# Patient Record
Sex: Male | Born: 1960 | Race: White | Hispanic: No | Smoking: Never smoker
Health system: Southern US, Community
[De-identification: ages and names within clinical notes are randomized; demographics above are authoritative.]

## PROBLEM LIST (undated history)

## (undated) DIAGNOSIS — F419 Anxiety disorder, unspecified: Secondary | ICD-10-CM

## (undated) DIAGNOSIS — G8929 Other chronic pain: Secondary | ICD-10-CM

## (undated) HISTORY — DX: Other chronic pain: G89.29

## (undated) HISTORY — PX: APPENDECTOMY: SHX54

## (undated) HISTORY — DX: Anxiety disorder, unspecified: F41.9

---

## 2008-06-29 ENCOUNTER — Ambulatory Visit: Payer: Self-pay | Admitting: Cardiology

## 2009-11-20 HISTORY — PX: COLONOSCOPY W/ POLYPECTOMY: SHX1380

## 2010-04-12 ENCOUNTER — Encounter: Payer: Self-pay | Admitting: Urgent Care

## 2010-04-12 ENCOUNTER — Ambulatory Visit: Payer: Self-pay | Admitting: Gastroenterology

## 2010-04-12 DIAGNOSIS — R109 Unspecified abdominal pain: Secondary | ICD-10-CM | POA: Insufficient documentation

## 2010-04-12 DIAGNOSIS — K921 Melena: Secondary | ICD-10-CM

## 2010-04-12 DIAGNOSIS — F411 Generalized anxiety disorder: Secondary | ICD-10-CM | POA: Insufficient documentation

## 2010-04-13 LAB — CONVERTED CEMR LAB
ALT: 89 units/L — ABNORMAL HIGH (ref 0–53)
AST: 58 units/L — ABNORMAL HIGH (ref 0–37)
Albumin: 4.8 g/dL (ref 3.5–5.2)
Basophils Absolute: 0 10*3/uL (ref 0.0–0.1)
Basophils Relative: 0 % (ref 0–1)
Calcium: 10.1 mg/dL (ref 8.4–10.5)
Eosinophils Absolute: 0.2 10*3/uL (ref 0.0–0.7)
Eosinophils Relative: 2 % (ref 0–5)
Glucose, Bld: 89 mg/dL (ref 70–99)
Indirect Bilirubin: 0.6 mg/dL (ref 0.0–0.9)
Lymphocytes Relative: 31 % (ref 12–46)
MCHC: 35.3 g/dL (ref 30.0–36.0)
Monocytes Absolute: 0.8 10*3/uL (ref 0.1–1.0)
Neutro Abs: 4.5 10*3/uL (ref 1.7–7.7)
Potassium: 5.1 meq/L (ref 3.5–5.3)
Sodium: 140 meq/L (ref 135–145)
Total Bilirubin: 0.8 mg/dL (ref 0.3–1.2)

## 2010-04-15 ENCOUNTER — Ambulatory Visit (HOSPITAL_COMMUNITY): Admission: RE | Admit: 2010-04-15 | Discharge: 2010-04-15 | Payer: Self-pay | Admitting: Gastroenterology

## 2010-04-19 ENCOUNTER — Telehealth (INDEPENDENT_AMBULATORY_CARE_PROVIDER_SITE_OTHER): Payer: Self-pay

## 2010-04-19 ENCOUNTER — Encounter: Payer: Self-pay | Admitting: Gastroenterology

## 2010-05-05 ENCOUNTER — Ambulatory Visit: Payer: Self-pay | Admitting: Gastroenterology

## 2010-05-05 ENCOUNTER — Ambulatory Visit (HOSPITAL_COMMUNITY): Admission: RE | Admit: 2010-05-05 | Discharge: 2010-05-05 | Payer: Self-pay | Admitting: Gastroenterology

## 2010-05-05 HISTORY — PX: COLONOSCOPY: SHX174

## 2010-05-09 ENCOUNTER — Encounter: Payer: Self-pay | Admitting: Gastroenterology

## 2010-12-20 NOTE — Progress Notes (Signed)
Summary: phone note/ requesting pain med  Phone Note Call from Patient   Caller: Patient Summary of Call: Pt called back and said he was half asleep this morning when i called him. He asked what i had told him.  He said he was given RX for a few pain pills, but would like some more til his TCS/Surgical referral. Uses [facility].  Initial call taken by: Cloria Spring LPN,  Apr 19, 2010 9:38 AM     Appended Document: phone note/ requesting pain med Just received VM from pt. He asked to make the pain medicine stronger, said he had to double up on the other to take he was in so much pain.  Appended Document: phone note/ requesting pain med Discussed w/ Dr Darrick Penna.  Can use tylenol for pain until procedure. No further narcotics at this time. To ER if severe pain.  Colonoscopy should be done in OR w/ propofol.  Appended Document: phone note/ requesting pain med LM to call.  Appended Document: phone note/ requesting pain med Pt was informed. York Spaniel he will have to go to the ED because his pain is so bad.

## 2010-12-20 NOTE — Letter (Signed)
Summary: TCS ORDER  TCS ORDER   Imported By: Ave Filter 04/19/2010 10:28:36  _____________________________________________________________________  External Attachment:    Type:   Image     Comment:   External Document  Appended Document: TCS ORDER [facility] wants this done in OR w/ propofol please re:hx narcotic use  Appended Document: TCS ORDER Pt scheduled in the or.

## 2010-12-20 NOTE — Letter (Signed)
Summary: SURGICAL REFERRAL  SURGICAL REFERRAL   Imported By: Ave Filter 05/09/2010 12:32:19  _____________________________________________________________________  External Attachment:    Type:   Image     Comment:   External Document  Appended Document: SURGICAL REFERRAL Pt scheduled 05/19/10@2 :15pm  Pt aware of appt.

## 2010-12-20 NOTE — Letter (Signed)
Summary: CT SCAN ORDER  CT SCAN ORDER   Imported By: Ave Filter 04/12/2010 13:01:19  _____________________________________________________________________  External Attachment:    Type:   Image     Comment:   External Document

## 2010-12-20 NOTE — Assessment & Plan Note (Signed)
Summary: ABD PAIN,PT SUSPECT HE HAS AN HERNIA/CM   Visit Type:  Initial Visit Referring Provider:  self Primary Care Provider:  Sherryll Burger  Chief Complaint:  abd pain.  History of Present Illness: 50 y/o caucasian male self-referred for "hernia."  Tells me he found this while in prison.  Noticed  3 months ago.  Located in right lower quad. Pain radiates to groin.  Pain constant 8/10. Feels at site of appendectom.  BM 3 daily without with small volume scant rectal bleeding on toilet tissue intermittantly.  Denies N/V/D.  Denies fever.  Occ heartburn rarely.  Not on meds.  Denies indigestion.  Denies dysphagia or odynophagia.  Weight loss 10#  in 3 months, unintentional. Pain worse w/ exercise.    Current Problems (verified): 1)  Anxiety  (ICD-300.00) 2)  Hematochezia  (ICD-578.1) 3)  Abdominal Pain  (ICD-789.00)  Current Medications (verified): 1)  Doxepin Hcl 50 Mg Caps (Doxepin Hcl) .... One Tablet At Bedtime 2)  Alprazolam 0.5 Mg Tabs (Alprazolam) .... Take 1 Tablet By Mouth Two Times A Day 3)  Tylenol Extra Strength 500 Mg Tabs (Acetaminophen) .... As Needed 4)  Vicodin 5-500 Mg Tabs (Hydrocodone-Acetaminophen) .Marland Kitchen.. 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  Alcohol Use - no Illicit Drug Use - no Patient gets regular exercise. Smoking Status:  never Drug Use:  no Does Patient Exercise:  yes  Review of Systems General:  Complains of chills, sweats, anorexia, fatigue, weakness, malaise, weight loss, and sleep disorder. CV:  Denies chest pains, angina, palpitations, syncope, dyspnea on exertion, orthopnea, PND, peripheral edema, and claudication. Resp:   Complains of coughing up blood; denies dyspnea at rest, dyspnea with exercise, cough, sputum, wheezing, and pleurisy; prior to going into prison per pt saw PCP for this. GI:  Denies difficulty swallowing, pain on swallowing, vomiting blood, jaundice, diarrhea, constipation, change in bowel habits, black BMs, and fecal incontinence. GU:  Denies urinary burning, blood in urine, urinary frequency, urinary hesitancy, nocturnal urination, and urinary incontinence. Derm:  Denies rash, itching, dry skin, hives, moles, warts, and unhealing ulcers. Psych:  Denies depression, anxiety, memory loss, suicidal ideation, hallucinations, paranoia, phobia, and confusion. Heme:  Denies bruising and enlarged lymph nodes.  Vital Signs:  Patient profile:   50 year old male Height:      71 inches Weight:      196 pounds BMI:     27.44 Temp:     97.9 degrees F oral Pulse rate:   72 / minute BP sitting:   122 / 80  (left arm) Cuff size:   regular  Vitals Entered By: Cloria Spring LPN (Apr 12, 2010 11:52 AM)  Physical Exam  General:  Well developed, well nourished, no acute distress. Head:  Normocephalic and atraumatic. Eyes:  Sclera clear, no icterus. Ears:  Normal auditory acuity. Nose:  No deformity, discharge,  or lesions. Mouth:  No deformity or lesions, dentition normal. Neck:  Supple; no masses or thyromegaly. Lungs:  Clear throughout to auscultation. Heart:  Regular rate and rhythm; no murmurs, rubs,  or bruits. Abdomen:  Soft, nondistended. No masses, hepatosplenomegalymod tender at site.  no appreciable hernia noted on exam.  Normal  bowel sounds.without guarding and without rebound.   Rectal:  deferred until time of colonoscopy.   Msk:  Symmetrical with no gross deformities. Normal posture. Pulses:  Normal pulses noted. Extremities:  No clubbing, cyanosis, edema or deformities noted. Neurologic:  Alert and  oriented x4;  grossly normal neurologically. Skin:  Intact without significant lesions  or rashes. Cervical Nodes:  No significant cervical adenopathy. Axillary Nodes:  No significant axillary adenopathy. Inguinal Nodes:  No significant inguinal adenopathy. Psych:  Alert and cooperative. Normal mood and affect.  Impression & Recommendations:  Problem # 1:  ABDOMINAL PAIN (ICD-789.00) 50 y/o caucasian male w/ 3 month hx Right-sided abd pain along site of previous appendectomy.  ?hernia, scar tissue, or diverticulitis (less likely).  Given FH colon ca, weight loss, and intermittant hematachezia, we need to r/o colorectal ca as well.  CT abd/pelvis w/ IV/oral contrast first, then colonoscopy.  Diagnostic colonoscopy to be performed by Dr. Jonette Eva in the near future.  I have discussed risks and benefits which include, but are not limited to, bleeding, infection, perforation, or medication reaction.  The patient agrees with this plan and consent will be obtained.  Orders: T-Basic Metabolic Panel [id number]) T-CBC w/Diff [phone]) T-Hepatic Function [phone]) New Patient Level III (29528)  Problem # 2:  HEMATOCHEZIA (ICD-578.1) See#1  Problem # 3:  ADENOCARCINOMA, COLON, FAMILY HX (ICD-V16.0) Prescriptions: VICODIN 5-500 MG TABS (HYDROCODONE-ACETAMINOPHEN) 1 by mouth q 4-6 hrs as needed pain  #20 x 0   Entered and Authorized by:   Joselyn Arrow FNP-BC   Signed by:   Joselyn Arrow FNP-BC on 04/12/2010   Method used:   Print then Give to Patient   RxID:   [id number]

## 2011-02-06 LAB — HEMOGLOBIN AND HEMATOCRIT, BLOOD: HCT: 43 % (ref 39.0–52.0)

## 2011-02-06 LAB — BASIC METABOLIC PANEL
BUN: 11 mg/dL (ref 6–23)
Chloride: 101 mEq/L (ref 96–112)
Creatinine, Ser: 1.06 mg/dL (ref 0.4–1.5)
Glucose, Bld: 84 mg/dL (ref 70–99)

## 2015-01-07 ENCOUNTER — Other Ambulatory Visit: Payer: Self-pay | Admitting: Anesthesiology

## 2015-01-07 ENCOUNTER — Ambulatory Visit
Admission: RE | Admit: 2015-01-07 | Discharge: 2015-01-07 | Disposition: A | Discharge status: Home / Self Care | Payer: Medicare HMO | Source: Ambulatory Visit | Attending: Anesthesiology | Admitting: Anesthesiology

## 2015-01-07 DIAGNOSIS — M545 Low back pain: Secondary | ICD-10-CM

## 2015-04-01 ENCOUNTER — Encounter: Payer: Self-pay | Admitting: Gastroenterology

## 2016-02-06 IMAGING — CR DG LUMBAR SPINE COMPLETE 4+V
5 series · 5 of 5 positions shown · non-contrast
Comparison: Lumbar spine series of September 22, 2014.

CLINICAL DATA: Low back pain and numbness in both lower extremities
greater on the right than on the left, history of multiple falls
most recently in October 2014

EXAM:
LUMBAR SPINE - COMPLETE 4+ VIEW

[t l-spine a.p.]
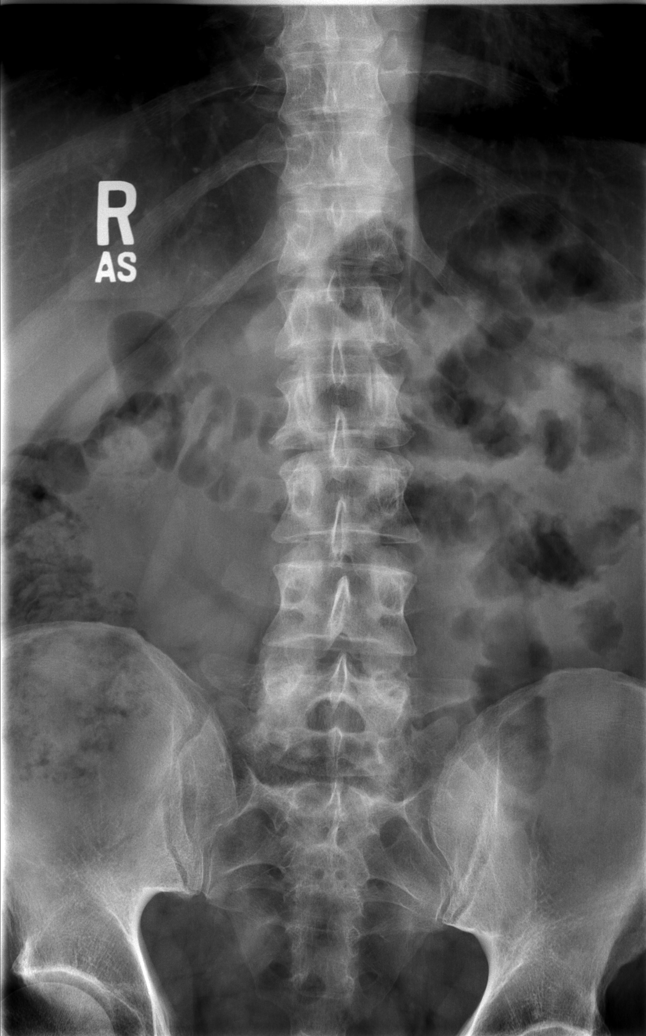

[t l-spine oblique exposure (1 of 2)]
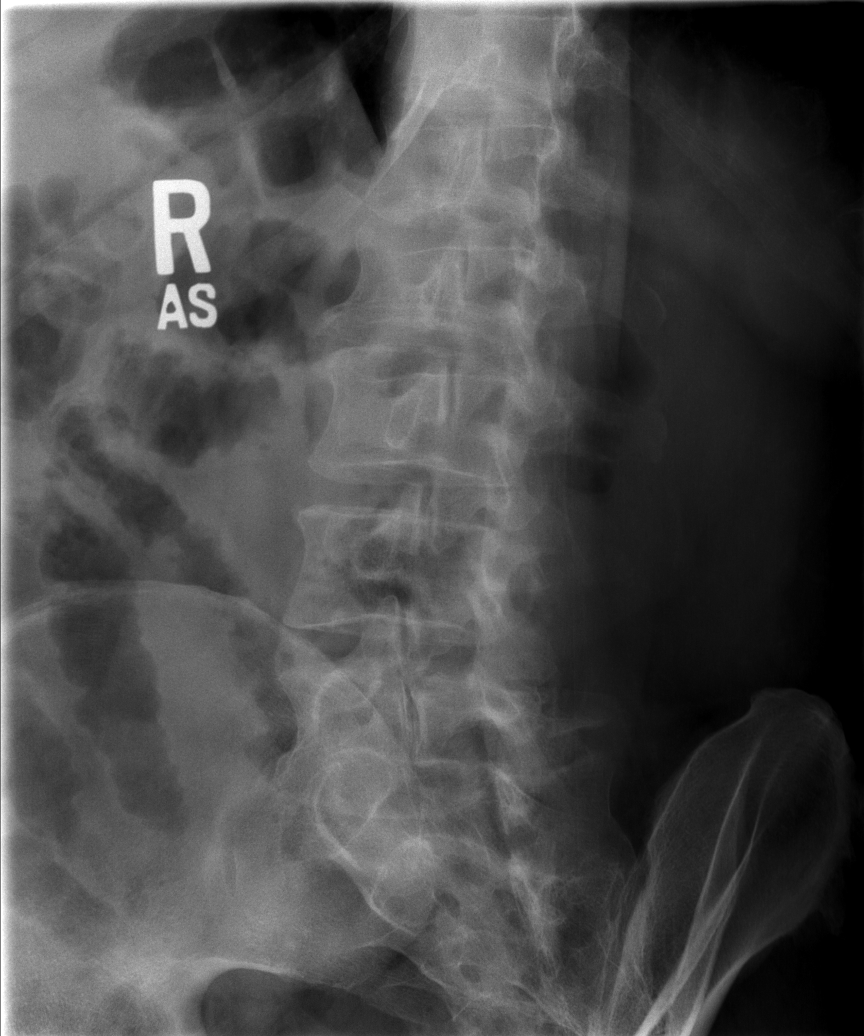

[t l-spine oblique exposure (2 of 2)]
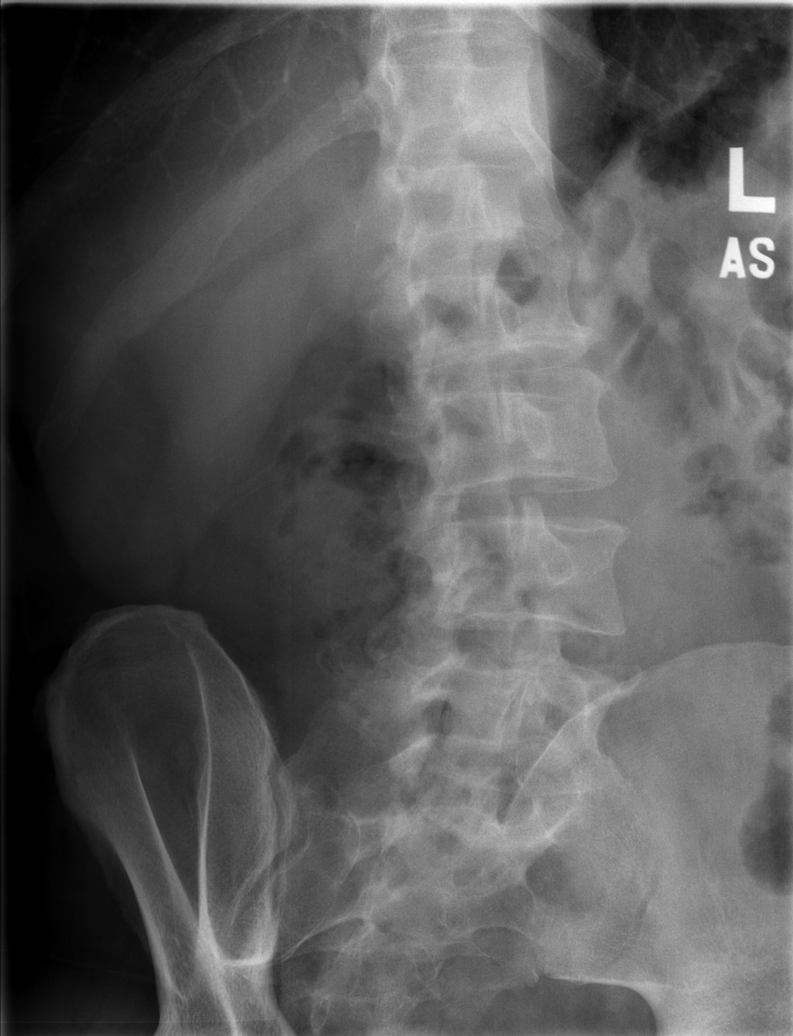

[t l-spine lat]
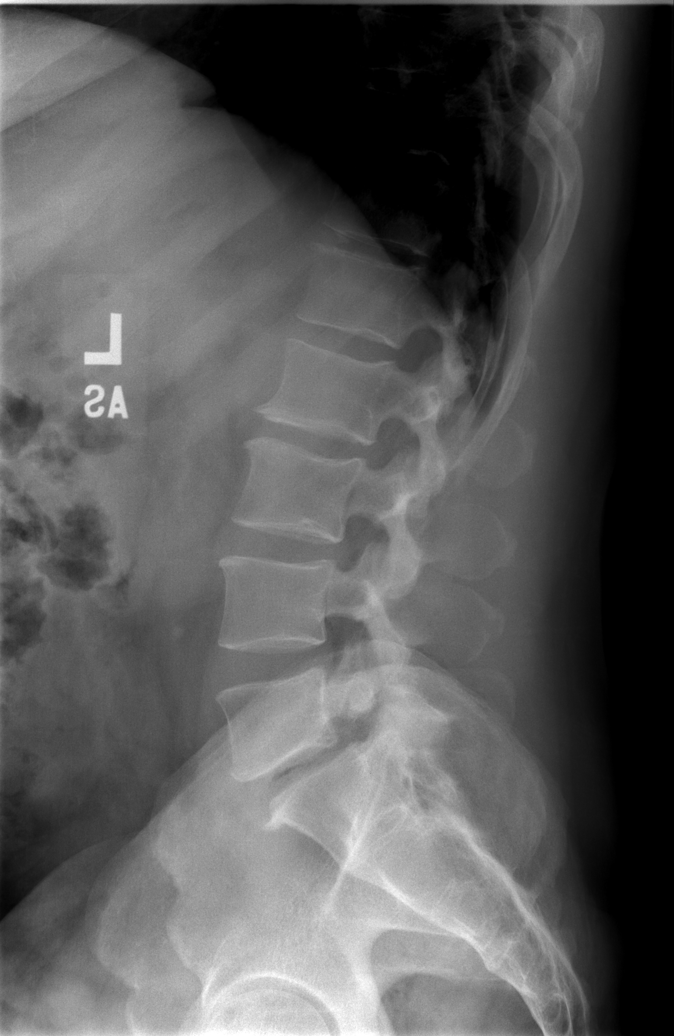

[t l-spine l5-s1 spot]
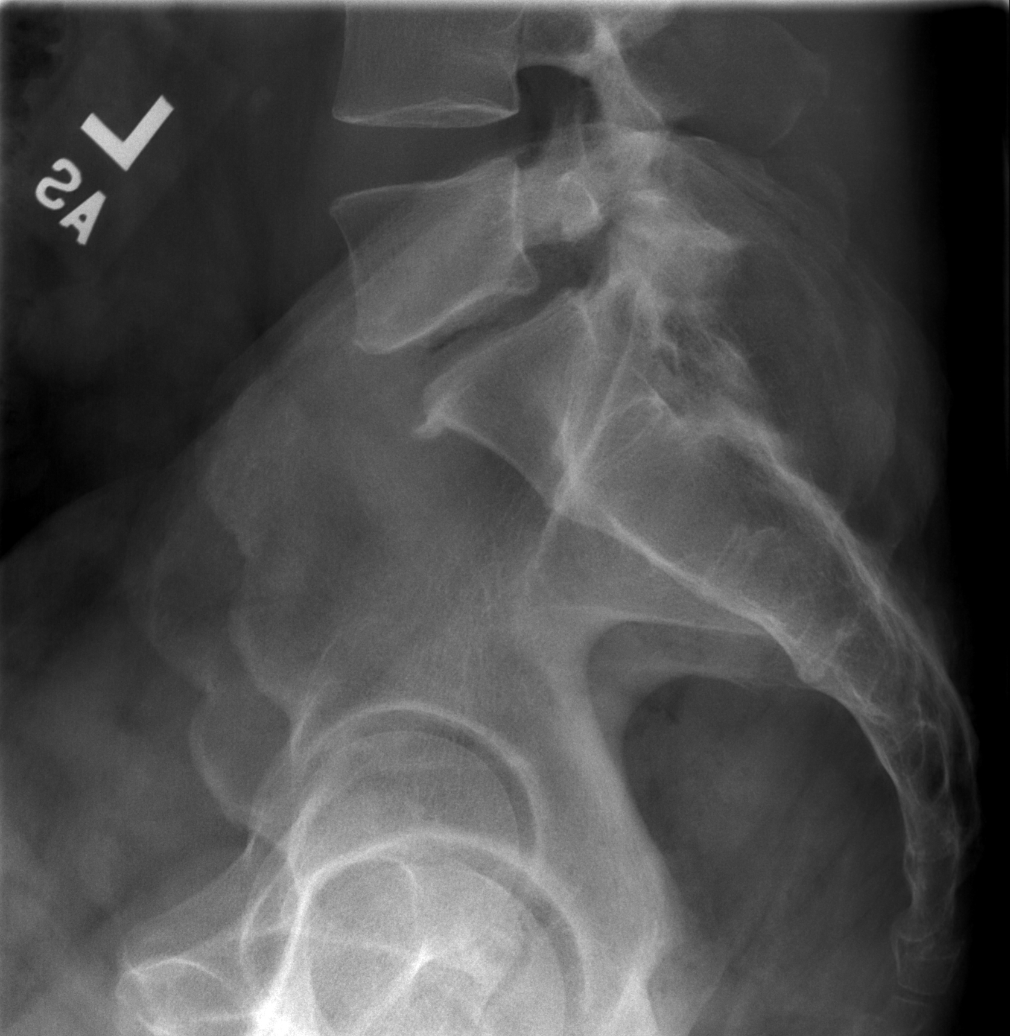

[5 of 5 positions shown; findings below may reference images not displayed]

FINDINGS: The lumbar vertebral bodies are preserved in height. There is grade
1 anterolisthesis of L5 with respect S1 on the basis of pars defects
that is more conspicuous today. The there is mild stable narrowing
of the L2-3 disc space with small anterior endplate osteophytes. The
pedicles and transverse processes are intact. The observed portions
of the sacrum are unremarkable.
IMPRESSION: 1. Increased conspicuity of grade 1 anterolisthesis of L5 with
respect S1. This is on the basis of bilateral pars defects and
degenerative disc disease.
2. Mild disc space narrowing and endplate osteophyte at L2-3. There
is no compression fracture.

## 2016-02-29 ENCOUNTER — Ambulatory Visit: Payer: Medicare HMO | Admitting: Gastroenterology

## 2016-03-24 ENCOUNTER — Ambulatory Visit: Payer: Medicare HMO | Admitting: Gastroenterology

## 2016-05-04 ENCOUNTER — Ambulatory Visit: Payer: Medicare HMO | Admitting: Nurse Practitioner

## 2018-10-23 ENCOUNTER — Telehealth: Payer: Self-pay | Admitting: *Deleted

## 2018-10-23 ENCOUNTER — Encounter: Payer: Self-pay | Admitting: Gastroenterology

## 2018-10-23 ENCOUNTER — Ambulatory Visit: Payer: Medicare Other | Admitting: Gastroenterology

## 2018-10-23 ENCOUNTER — Encounter: Payer: Self-pay | Admitting: *Deleted

## 2018-10-23 ENCOUNTER — Other Ambulatory Visit: Payer: Self-pay | Admitting: *Deleted

## 2018-10-23 DIAGNOSIS — Z860100 Personal history of colon polyps, unspecified: Secondary | ICD-10-CM | POA: Insufficient documentation

## 2018-10-23 DIAGNOSIS — Z800 Family history of malignant neoplasm of digestive organs: Secondary | ICD-10-CM

## 2018-10-23 NOTE — Assessment & Plan Note (Signed)
NO WARNING SIGNS/SYMPTOMS.   DRINK WATER TO KEEP YOUR URINE LIGHT YELLOW. FOLLOW A HIGH FIBER DIET. AVOID ITEMS THAT CAUSE BLOATING & GAS.  HANDOUT GIVEN. COMPLETE COLONOSCOPY  IN 3-4 WEEKS w/ MAC due to polypharmacy. YOU MAY BRING THE ENEMA TO ADMINISTER IN THE PREOP AREA. DISCUSSED PROCEDURE, BENEFITS, & RISKS: < 1% chance of medication reaction, bleeding, perforation, or rupture of spleen/liver.  FOLLOW UP IN THE OFFICE WILL BE SCHEDULED IF NEEDED AFTER ENDOSCOPY.

## 2018-10-23 NOTE — Progress Notes (Signed)
CC'D TO PCP °

## 2018-10-23 NOTE — Patient Instructions (Signed)
DRINK WATER TO KEEP YOUR URINE LIGHT YELLOW.  FOLLOW A HIGH FIBER DIET. AVOID ITEMS THAT CAUSE BLOATING & GAS. SEE INFO BELOW.  COMPLETE COLONOSCOPY  IN 3-4 WEEKS. YOU MAY BRING THE ENEMA TO ADMINISTER IN THE PREOP AREA.  FOLLOW UP IN THE OFFICE WILL BE SCHEDULED IF NEEDED AFTER ENDOSCOPY.  High-Fiber Diet A high-fiber diet changes your normal diet to include more whole grains, legumes, fruits, and vegetables. Changes in the diet involve replacing refined carbohydrates with unrefined foods. The calorie level of the diet is essentially unchanged. The Dietary Reference Intake (recommended amount) for adult males is 38 grams per day. For adult females, it is 25 grams per day. Pregnant and lactating women should consume 28 grams of fiber per day. Fiber is the intact part of a plant that is not broken down during digestion. Functional fiber is fiber that has been isolated from the plant to provide a beneficial effect in the body.  PURPOSE  Increase stool bulk.   Ease and regulate bowel movements.   Lower cholesterol.   REDUCE RISK OF COLON CANCER  INDICATIONS THAT YOU NEED MORE FIBER  Constipation and hemorrhoids.   Uncomplicated diverticulosis (intestine condition) and irritable bowel syndrome.   Weight management.   As a protective measure against hardening of the arteries (atherosclerosis), diabetes, and cancer.   GUIDELINES FOR INCREASING FIBER IN THE DIET  Start adding fiber to the diet slowly. A gradual increase of about 5 more grams (2 slices of whole-wheat bread, 2 servings of most fruits or vegetables, or 1 bowl of high-fiber cereal) per day is best. Too rapid an increase in fiber may result in constipation, flatulence, and bloating.   Drink enough water and fluids to keep your urine clear or pale yellow. Water, juice, or caffeine-free drinks are recommended. Not drinking enough fluid may cause constipation.   Eat a variety of high-fiber foods rather than one type of fiber.    Try to increase your intake of fiber through using high-fiber foods rather than fiber pills or supplements that contain small amounts of fiber.   The goal is to change the types of food eaten. Do not supplement your present diet with high-fiber foods, but replace foods in your present diet.  INCLUDE A VARIETY OF FIBER SOURCES  Replace refined and processed grains with whole grains, canned fruits with fresh fruits, and incorporate other fiber sources. White rice, white breads, and most bakery goods contain little or no fiber.   Brown whole-grain rice, buckwheat oats, and many fruits and vegetables are all good sources of fiber. These include: broccoli, Brussels sprouts, cabbage, cauliflower, beets, sweet potatoes, white potatoes (skin on), carrots, tomatoes, eggplant, squash, berries, fresh fruits, and dried fruits.   Cereals appear to be the richest source of fiber. Cereal fiber is found in whole grains and bran. Bran is the fiber-rich outer coat of cereal grain, which is largely removed in refining. In whole-grain cereals, the bran remains. In breakfast cereals, the largest amount of fiber is found in those with "bran" in their names. The fiber content is sometimes indicated on the label.   You may need to include additional fruits and vegetables each day.   In baking, for 1 cup white flour, you may use the following substitutions:   1 cup whole-wheat flour minus 2 tablespoons.   1/2 cup white flour plus 1/2 cup whole-wheat flour.

## 2018-10-23 NOTE — Progress Notes (Signed)
Subjective:    Patient ID: Anthony Knapp, male    DOB: 05-22-1961, 57 y.o.   MRN: [id number] Kirstie Peri, MD  HPI HAS A BM EVERY TIME HE EATS. CAN BE RUNNY TO SOLID. BMs: 4-5. RUNS 2-3 MILES A DA FOR PAST 2-3 YEARS. APPETITE: GOOD. WEIGHT WAS: 190-195 LBS. CURRENT WEIGHT: 167 LBS FOR PAST SEVERAL MOS. ANXIETY: SEVERE, MEDS: Scarlette Calico SEES MH SPECIALIST([facility]). SX: ABOUT THE SAME. HAD PROBLEM WAKING UP FROM EGD DONE BY DR. Gabriel Cirri. MOTHER(BEAT COLON CAN AND THEN GOT LUNG CA) RECENTLY PASSED ~1 YR AGO. SHE MADE HIM PROMISE TO GET TCS. OCCASIONAL ETOH. NO SMOKE. NEGATIVE FOR HEP C. RARE IBUPROFEN. NO ASPIRIN, BC/GOODY POWDERS, IBUPROFEN/MOTRIN, OR NAPROXEN/ALEVE.   PT DENIES FEVER, CHILLS, HEMATOCHEZIA, HEMATEMESIS, nausea, vomiting, melena, CHEST PAIN, SHORTNESS OF BREATH, CHANGE IN BOWEL IN HABITS, constipation, abdominal pain, problems swallowing,  OR heartburn or indigestion.  Past Medical History:  Diagnosis Date  . Anxiety   . Chronic pain     Past Surgical History:  Procedure Laterality Date  . APPENDECTOMY    . COLONOSCOPY  05/05/10   WUJ:[id number] tissue removed  . COLONOSCOPY W/ POLYPECTOMY  2011   3 POLYPOID LESIONS REMOVED   No Known Allergies  Current Outpatient Medications  Medication Sig    . Buprenorphine HCl-Naloxone HCl 12-3 MG FILM Take 1 Film by mouth 2 (two) times daily.    . clonazePAM (KLONOPIN) 1 MG tablet Take 2.5 mg by mouth daily.    . sildenafil (REVATIO) 20 MG tablet Take 20 mg by mouth as needed.     Family History  Problem Relation Age of Onset  . Colon cancer Mother        AGE > 60  . Irritable bowel syndrome Sister   . Crohn's disease Sister   . Diabetes Sister   . Crohn's disease Sister   . Irritable bowel syndrome Sister   . Hepatitis C Sister   . Colon polyps Neg Hx     Social History   Socioeconomic History  . Marital status: Divorced    Spouse name: Not on file  . Number of children: Not on file  .  Years of education: Not on file  . Highest education level: Not on file  Occupational History  . Not on file  Social Needs  . Financial resource strain: Not on file  . Food insecurity:    Worry: Not on file    Inability: Not on file  . Transportation needs:    Medical: Not on file    Non-medical: Not on file  Tobacco Use  . Smoking status: Never Smoker  . Smokeless tobacco: Never Used  Substance and Sexual Activity  . Alcohol use: Yes    Comment: occ  . Drug use: Never  . Sexual activity: Not on file  Lifestyle  . Physical activity:    Days per week: Not on file    Minutes per session: Not on file  . Stress: Not on file  Relationships  . Social connections:    Talks on phone: Not on file    Gets together: Not on file    Attends religious service: Not on file    Active member of club or organization: Not on file    Attends meetings of clubs or organizations: Not on file    Relationship status: Not on file  Other Topics Concern  . Not on file  Social History Narrative   DESCRIBES HIMSELF AS OCD.  MOTHER PASSED IN 2018. [facility] [employer] [facility] [employer] AND THEN [employer]. DISABLED DUE TO SHOULDER INJURY.    Review of Systems     Objective:   Physical Exam  Constitutional: He is oriented to person, place, and time. He appears well-developed and well-nourished. No distress.  HENT:  Head: Normocephalic and atraumatic.  Mouth/Throat: Oropharynx is clear and moist. No oropharyngeal exudate.  Eyes: Pupils are equal, round, and reactive to light. No scleral icterus.  Neck: Normal range of motion. Neck supple.  Cardiovascular: Normal rate, regular rhythm and normal heart sounds.  Pulmonary/Chest: Effort normal and breath sounds normal. No respiratory distress.  Abdominal: Soft. Bowel sounds are normal. He exhibits no distension. There is no tenderness.  Musculoskeletal: He exhibits no edema.  Lymphadenopathy:    He has no cervical adenopathy.  Neurological: He is alert and oriented  to person, place, and time.  NO  NEW FOCAL DEFICITS  Psychiatric:  FLAT AFFECT, SLIGHTLY ANXIOUS MOOD  Vitals reviewed.     Assessment & Plan:

## 2018-10-23 NOTE — Telephone Encounter (Signed)
Pre-op scheduled for 01/10/19 at 10:00am. Called patient and he advised me to speak with his friend Melva (on dpr). She is aware of appt. Letter mailed.

## 2019-01-10 ENCOUNTER — Encounter (HOSPITAL_COMMUNITY)
Admission: RE | Admit: 2019-01-10 | Discharge: 2019-01-10 | Disposition: A | Discharge status: Home / Self Care | Payer: Medicare Other | Source: Ambulatory Visit | Attending: Gastroenterology | Admitting: Gastroenterology

## 2019-01-10 NOTE — Pre-Procedure Instructions (Signed)
Dr Benay Pillow notified of last potassium of 5.3. We Do I-stat on arrival 01/14/2019.

## 2019-01-10 NOTE — Pre-Procedure Instructions (Signed)
Note from 1000 am 01/10/2019 documented on wrong chart.

## 2019-01-13 ENCOUNTER — Telehealth: Payer: Self-pay | Admitting: Gastroenterology

## 2019-01-13 NOTE — Telephone Encounter (Signed)
Endo called and said they could not reach this patient for his procedure tomorrow, called his contact and she stated she soul not be seeing him anymore and did not have an alternative number for him

## 2019-01-13 NOTE — Telephone Encounter (Signed)
Patient showed up for pre-op. Confirmed with carolyn

## 2019-01-14 ENCOUNTER — Encounter (HOSPITAL_COMMUNITY): Payer: Self-pay | Admitting: Gastroenterology

## 2019-01-14 ENCOUNTER — Ambulatory Visit (HOSPITAL_COMMUNITY): Admission: RE | Admit: 2019-01-14 | Payer: Medicare Other | Source: Ambulatory Visit | Admitting: Gastroenterology

## 2019-01-14 ENCOUNTER — Encounter (HOSPITAL_COMMUNITY): Admission: RE | Payer: Self-pay | Source: Ambulatory Visit

## 2019-01-14 SURGERY — COLONOSCOPY WITH PROPOFOL
Anesthesia: Monitor Anesthesia Care

## 2019-01-14 NOTE — Telephone Encounter (Signed)
noted 

## 2019-01-14 NOTE — Telephone Encounter (Signed)
DO NOT RSC. PT WILL NEED TO CONTACT us TO HAVE AN OPV PRIOR TO BEING RSC'd FOR TCS.

## 2020-05-18 DIAGNOSIS — R6889 Other general symptoms and signs: Secondary | ICD-10-CM | POA: Diagnosis not present

## 2020-05-18 DIAGNOSIS — Z136 Encounter for screening for cardiovascular disorders: Secondary | ICD-10-CM | POA: Diagnosis not present

## 2020-05-18 DIAGNOSIS — G8929 Other chronic pain: Secondary | ICD-10-CM | POA: Diagnosis not present

## 2020-05-18 DIAGNOSIS — G4700 Insomnia, unspecified: Secondary | ICD-10-CM | POA: Diagnosis not present

## 2020-05-18 DIAGNOSIS — Z0000 Encounter for general adult medical examination without abnormal findings: Secondary | ICD-10-CM | POA: Diagnosis not present

## 2022-06-23 DIAGNOSIS — Z79899 Other long term (current) drug therapy: Secondary | ICD-10-CM | POA: Diagnosis not present

## 2022-06-23 DIAGNOSIS — Z5181 Encounter for therapeutic drug level monitoring: Secondary | ICD-10-CM | POA: Diagnosis not present

## 2022-07-08 NOTE — Congregational Nurse Program (Signed)
  Dept: [phone]   Congregational Nurse Program Note  Date of Encounter: 07/08/2022  Past Medical History: Past Medical History:  Diagnosis Date   Anxiety    Chronic pain     Encounter Details:  CNP Questionnaire - 07/07/22 1235       Questionnaire   Location Patient Served  Holiday representative, Corporate investment banker or Organization    Patient Status Unknown    Engineer, building services or [employer] Referral N/A    Medication N/A    Medical Provider Yes    Screening Referrals N/A    Medical Referral N/A    Medical Appointment Made N/A    Food N/A    Transportation N/A    Housing/Utilities N/A    Interpersonal Safety N/A    Intervention Blood pressure    ED Visit Averted N/A    Life-Saving Intervention Made N/A           No complaints or concerns BP 138/85  P 68 Jenene Slicker RN

## 2022-07-18 NOTE — Congregational Nurse Program (Signed)
  Dept: [phone]   Congregational Nurse Program Note  Date of Encounter: 07/10/2022  Past Medical History: Past Medical History:  Diagnosis Date   Anxiety    Chronic pain     Encounter Details:  CNP Questionnaire - 07/10/22 1250       Questionnaire   Do you give verbal consent to treat you today? Yes    Location Patient Information systems manager, Corporate investment banker or Organization    Patient Status Unknown    Engineer, building services or [employer] Referral N/A    Medication N/A    Medical Provider Yes    Screening Referrals N/A    Medical Referral N/A    Medical Appointment Made N/A    Food N/A    Housing/Utilities N/A    Interpersonal Safety N/A    Intervention Blood pressure    ED Visit Averted N/A           No  complaints or concerns. Stated he has history of hypertension and takes his medications as prescribed. BP 154/80 P 52 Jenene Slicker RN

## 2022-09-22 DIAGNOSIS — L03116 Cellulitis of left lower limb: Secondary | ICD-10-CM | POA: Diagnosis not present

## 2022-09-22 DIAGNOSIS — M25572 Pain in left ankle and joints of left foot: Secondary | ICD-10-CM | POA: Diagnosis not present

## 2022-09-22 DIAGNOSIS — R600 Localized edema: Secondary | ICD-10-CM | POA: Diagnosis not present

## 2022-09-22 DIAGNOSIS — L03115 Cellulitis of right lower limb: Secondary | ICD-10-CM | POA: Diagnosis not present

## 2024-07-09 DIAGNOSIS — Z79899 Other long term (current) drug therapy: Secondary | ICD-10-CM | POA: Diagnosis not present

## 2024-07-16 DIAGNOSIS — R600 Localized edema: Secondary | ICD-10-CM | POA: Diagnosis not present

## 2024-07-16 DIAGNOSIS — Z0000 Encounter for general adult medical examination without abnormal findings: Secondary | ICD-10-CM | POA: Diagnosis not present

## 2024-07-17 DIAGNOSIS — T1490XA Injury, unspecified, initial encounter: Secondary | ICD-10-CM | POA: Diagnosis not present

## 2024-07-17 DIAGNOSIS — S82442A Displaced spiral fracture of shaft of left fibula, initial encounter for closed fracture: Secondary | ICD-10-CM | POA: Diagnosis not present

## 2024-07-18 DIAGNOSIS — A4902 Methicillin resistant Staphylococcus aureus infection, unspecified site: Secondary | ICD-10-CM | POA: Diagnosis not present

## 2024-07-18 DIAGNOSIS — S8292XA Unspecified fracture of left lower leg, initial encounter for closed fracture: Secondary | ICD-10-CM | POA: Diagnosis not present

## 2024-07-18 DIAGNOSIS — L03116 Cellulitis of left lower limb: Secondary | ICD-10-CM | POA: Diagnosis not present

## 2024-07-18 DIAGNOSIS — R601 Generalized edema: Secondary | ICD-10-CM | POA: Diagnosis not present

## 2024-07-18 DIAGNOSIS — X58XXXA Exposure to other specified factors, initial encounter: Secondary | ICD-10-CM | POA: Diagnosis not present

## 2024-07-18 DIAGNOSIS — S82832A Other fracture of upper and lower end of left fibula, initial encounter for closed fracture: Secondary | ICD-10-CM | POA: Diagnosis not present

## 2024-07-18 DIAGNOSIS — S82435A Nondisplaced oblique fracture of shaft of left fibula, initial encounter for closed fracture: Secondary | ICD-10-CM | POA: Diagnosis not present

## 2024-07-18 DIAGNOSIS — W19XXXA Unspecified fall, initial encounter: Secondary | ICD-10-CM | POA: Diagnosis not present

## 2024-07-18 DIAGNOSIS — R791 Abnormal coagulation profile: Secondary | ICD-10-CM | POA: Diagnosis not present

## 2024-07-19 DIAGNOSIS — S82832A Other fracture of upper and lower end of left fibula, initial encounter for closed fracture: Secondary | ICD-10-CM | POA: Diagnosis not present

## 2024-07-19 DIAGNOSIS — W19XXXA Unspecified fall, initial encounter: Secondary | ICD-10-CM | POA: Diagnosis not present

## 2024-07-19 DIAGNOSIS — R791 Abnormal coagulation profile: Secondary | ICD-10-CM | POA: Diagnosis not present

## 2024-07-22 DIAGNOSIS — S82892A Other fracture of left lower leg, initial encounter for closed fracture: Secondary | ICD-10-CM | POA: Diagnosis not present

## 2024-07-25 DIAGNOSIS — S82852A Displaced trimalleolar fracture of left lower leg, initial encounter for closed fracture: Secondary | ICD-10-CM | POA: Diagnosis not present

## 2024-07-31 DIAGNOSIS — Y999 Unspecified external cause status: Secondary | ICD-10-CM | POA: Diagnosis not present

## 2024-07-31 DIAGNOSIS — S82852A Displaced trimalleolar fracture of left lower leg, initial encounter for closed fracture: Secondary | ICD-10-CM | POA: Diagnosis not present

## 2024-07-31 DIAGNOSIS — S93432A Sprain of tibiofibular ligament of left ankle, initial encounter: Secondary | ICD-10-CM | POA: Diagnosis not present

## 2024-07-31 DIAGNOSIS — X58XXXA Exposure to other specified factors, initial encounter: Secondary | ICD-10-CM | POA: Diagnosis not present

## 2024-07-31 DIAGNOSIS — G8918 Other acute postprocedural pain: Secondary | ICD-10-CM | POA: Diagnosis not present

## 2024-08-08 DIAGNOSIS — S82445P Nondisplaced spiral fracture of shaft of left fibula, subsequent encounter for closed fracture with malunion: Secondary | ICD-10-CM | POA: Diagnosis not present

## 2024-08-08 DIAGNOSIS — S82852D Displaced trimalleolar fracture of left lower leg, subsequent encounter for closed fracture with routine healing: Secondary | ICD-10-CM | POA: Diagnosis not present

## 2024-08-15 DIAGNOSIS — S82445P Nondisplaced spiral fracture of shaft of left fibula, subsequent encounter for closed fracture with malunion: Secondary | ICD-10-CM | POA: Diagnosis not present

## 2024-08-15 DIAGNOSIS — Z131 Encounter for screening for diabetes mellitus: Secondary | ICD-10-CM | POA: Diagnosis not present

## 2024-08-15 DIAGNOSIS — Z13220 Encounter for screening for lipoid disorders: Secondary | ICD-10-CM | POA: Diagnosis not present

## 2024-08-20 DIAGNOSIS — S82445P Nondisplaced spiral fracture of shaft of left fibula, subsequent encounter for closed fracture with malunion: Secondary | ICD-10-CM | POA: Diagnosis not present

## 2024-09-03 DIAGNOSIS — I10 Essential (primary) hypertension: Secondary | ICD-10-CM | POA: Diagnosis not present

## 2024-09-03 DIAGNOSIS — Z79899 Other long term (current) drug therapy: Secondary | ICD-10-CM | POA: Diagnosis not present

## 2024-09-03 DIAGNOSIS — Z8619 Personal history of other infectious and parasitic diseases: Secondary | ICD-10-CM | POA: Diagnosis not present

## 2024-09-03 DIAGNOSIS — S82445P Nondisplaced spiral fracture of shaft of left fibula, subsequent encounter for closed fracture with malunion: Secondary | ICD-10-CM | POA: Diagnosis not present

## 2024-09-03 DIAGNOSIS — Z1211 Encounter for screening for malignant neoplasm of colon: Secondary | ICD-10-CM | POA: Diagnosis not present

## 2024-09-03 DIAGNOSIS — S93409A Sprain of unspecified ligament of unspecified ankle, initial encounter: Secondary | ICD-10-CM | POA: Diagnosis not present

## 2024-09-08 DIAGNOSIS — S82852D Displaced trimalleolar fracture of left lower leg, subsequent encounter for closed fracture with routine healing: Secondary | ICD-10-CM | POA: Diagnosis not present

## 2024-09-08 DIAGNOSIS — M25572 Pain in left ankle and joints of left foot: Secondary | ICD-10-CM | POA: Diagnosis not present

## 2024-09-12 DIAGNOSIS — D696 Thrombocytopenia, unspecified: Secondary | ICD-10-CM | POA: Diagnosis not present

## 2024-09-12 DIAGNOSIS — E039 Hypothyroidism, unspecified: Secondary | ICD-10-CM | POA: Diagnosis not present

## 2024-09-12 DIAGNOSIS — I10 Essential (primary) hypertension: Secondary | ICD-10-CM | POA: Diagnosis not present
